# Patient Record
Sex: Male | Born: 1945 | Race: White | Hispanic: No | Marital: Married | Smoking: Former smoker
Health system: Southern US, Community
[De-identification: ages and names within clinical notes are randomized; demographics above are authoritative.]

## PROBLEM LIST (undated history)

## (undated) DIAGNOSIS — J449 Chronic obstructive pulmonary disease, unspecified: Secondary | ICD-10-CM

## (undated) HISTORY — PX: LASIK: SHX215

## (undated) HISTORY — PX: VASECTOMY: SHX75

## (undated) HISTORY — PX: TONSILLECTOMY: SUR1361

---

## 2010-10-15 ENCOUNTER — Ambulatory Visit: Payer: Self-pay | Admitting: Family Medicine

## 2012-06-28 IMAGING — CT CT ABD-PELV W/ CM
1 of 3 series · 12 of 32 positions shown, 18 images · non-contrast
Comparison: none

REASON FOR EXAM: CR [id number]   Rectal Bleeding
COMMENTS:

[Series 2: soft tissue · axial · 0.93mm/px · z∈[-336,+132]mm · 12 of 184 slices shown, 18 images]
[im 14/184  soft-tissue]
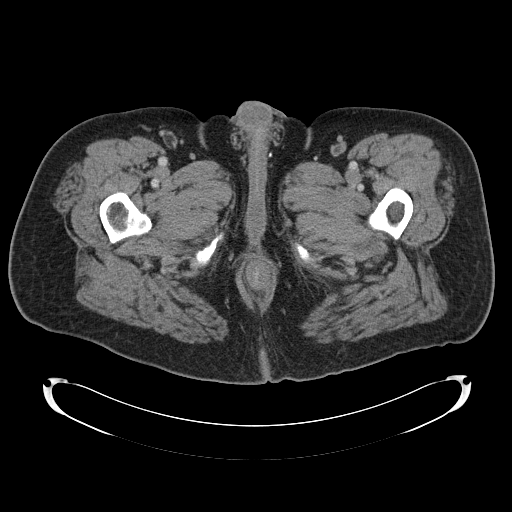
[im 14/184  bone]
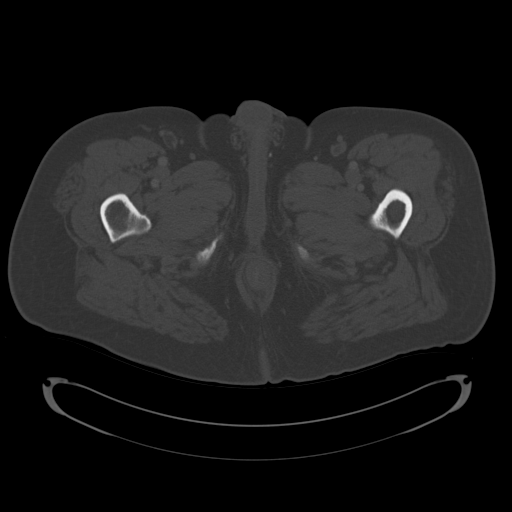
[im 27/184  soft-tissue]
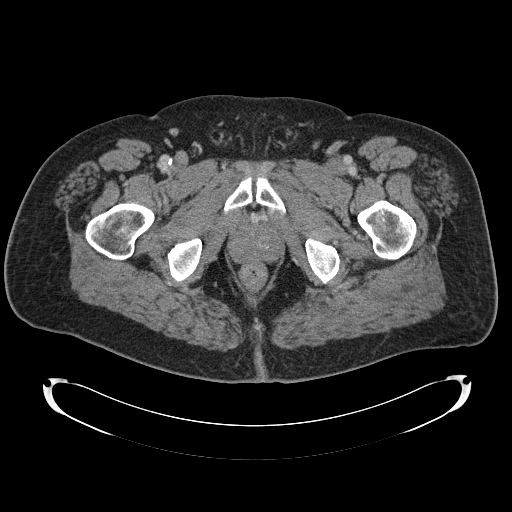
[im 40/184  soft-tissue]
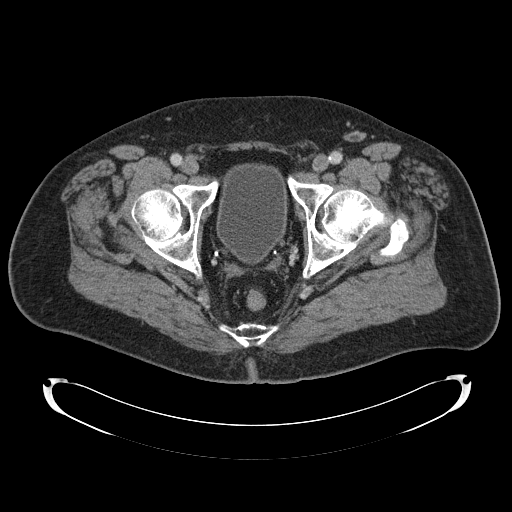
[im 53/184  soft-tissue]
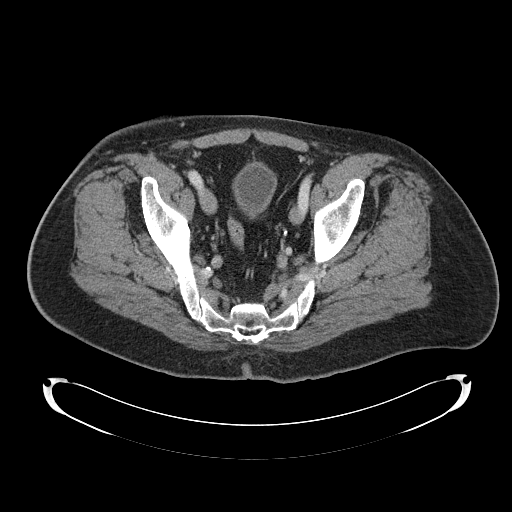
[im 66/184  soft-tissue]
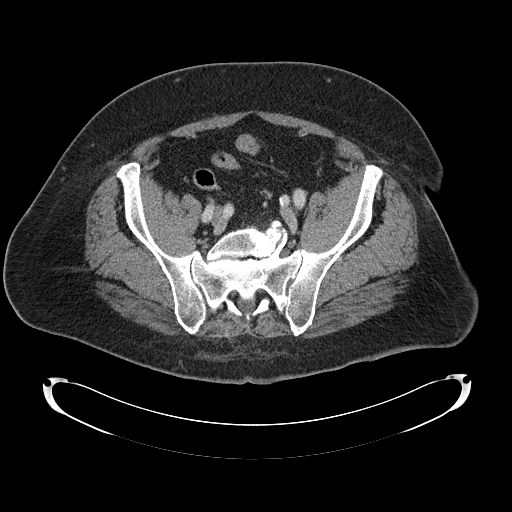
[im 79/184  soft-tissue]
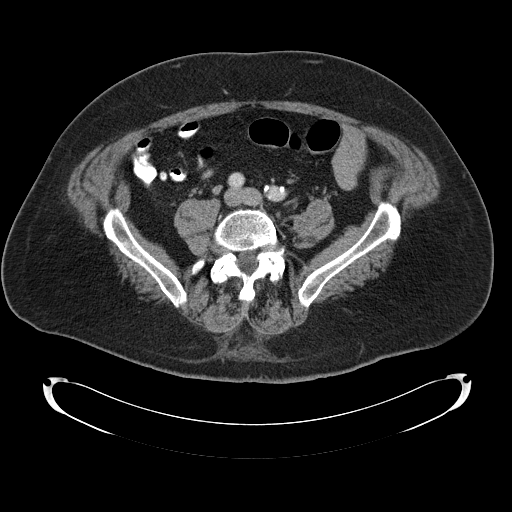
[im 105/184  soft-tissue]
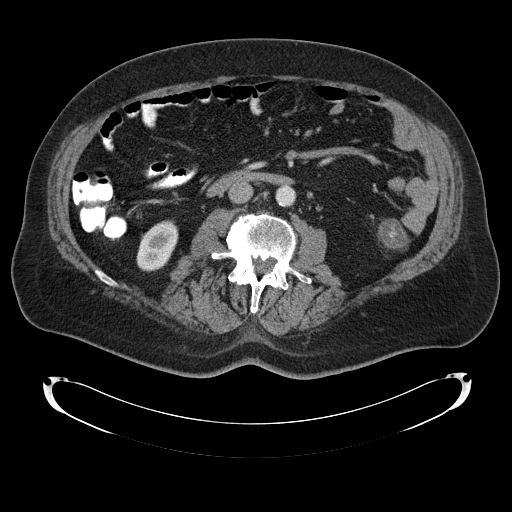
[im 118/184  soft-tissue]
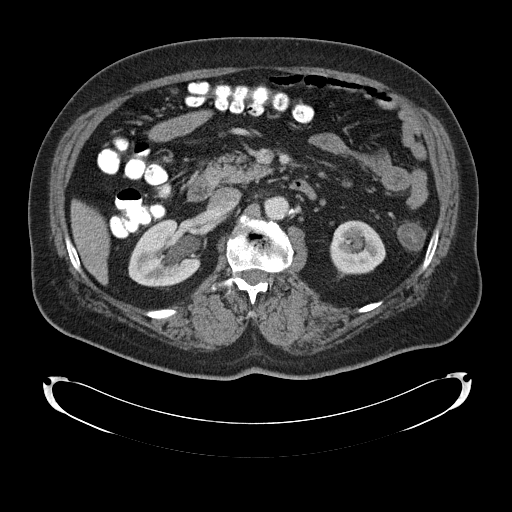
[im 131/184  soft-tissue]
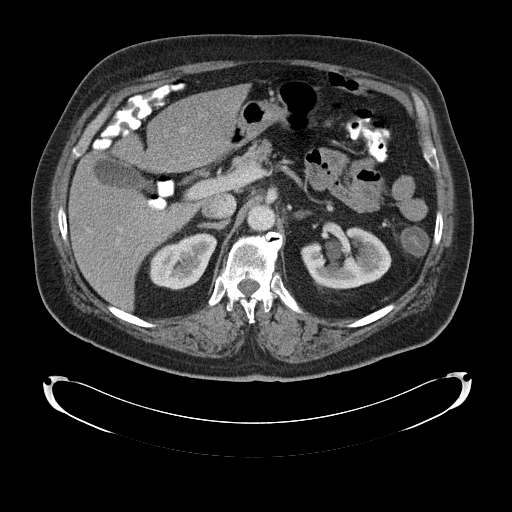
[im 131/184  lung]
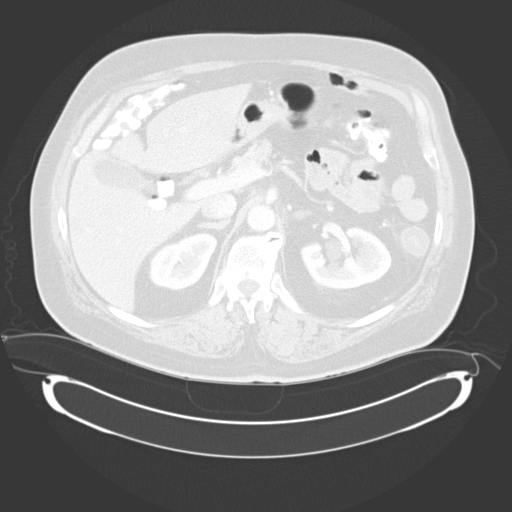
[im 131/184  bone]
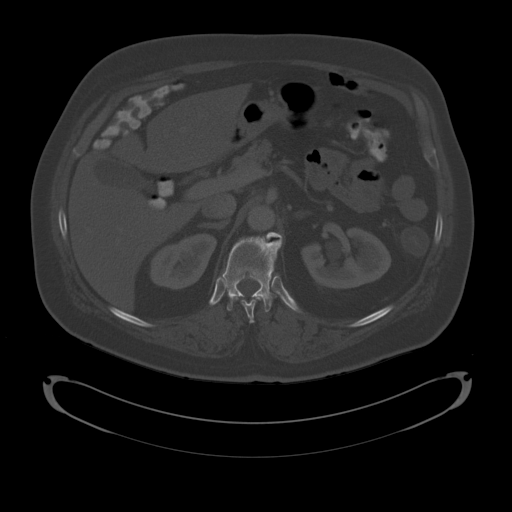
[im 144/184  soft-tissue]
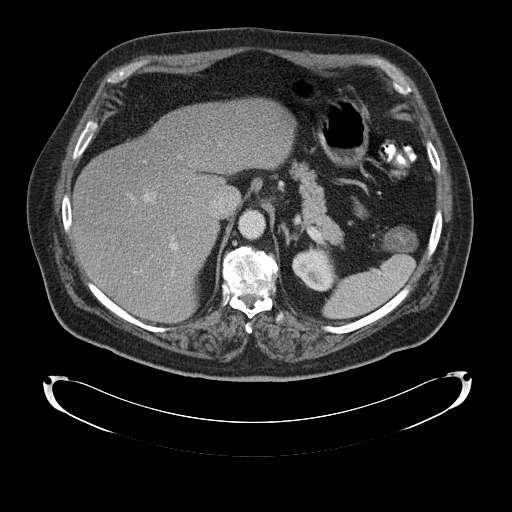
[im 144/184  lung]
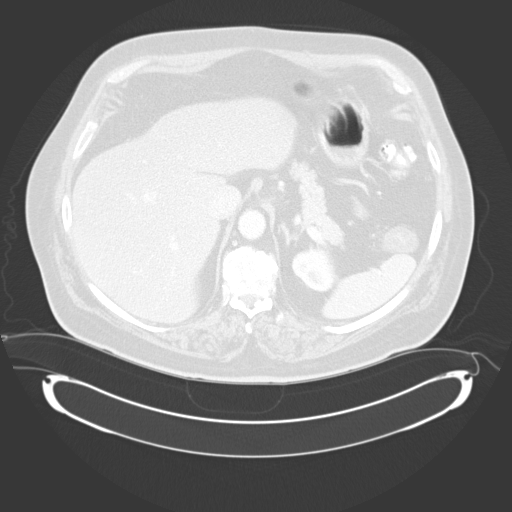
[im 157/184  soft-tissue]
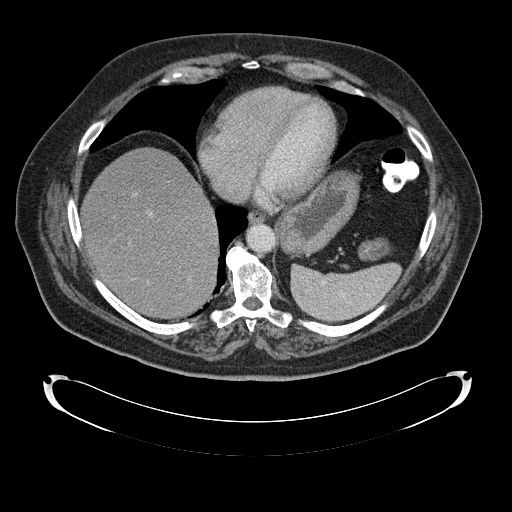
[im 157/184  lung]
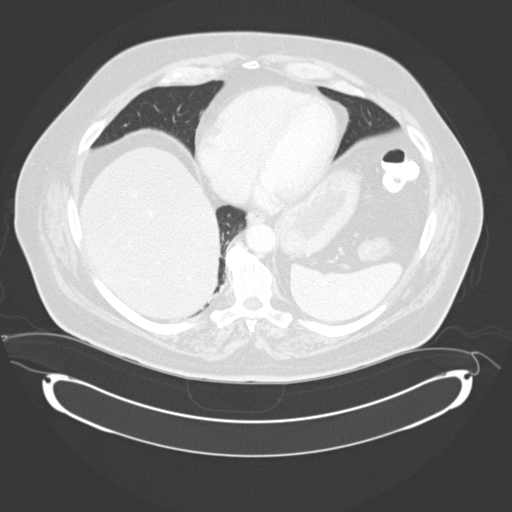
[im 170/184  soft-tissue]
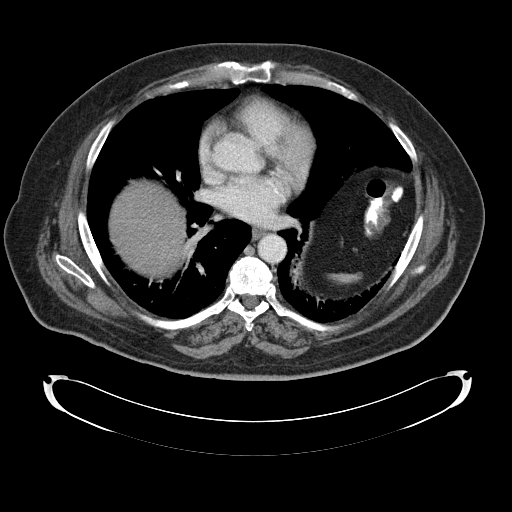
[im 170/184  lung]
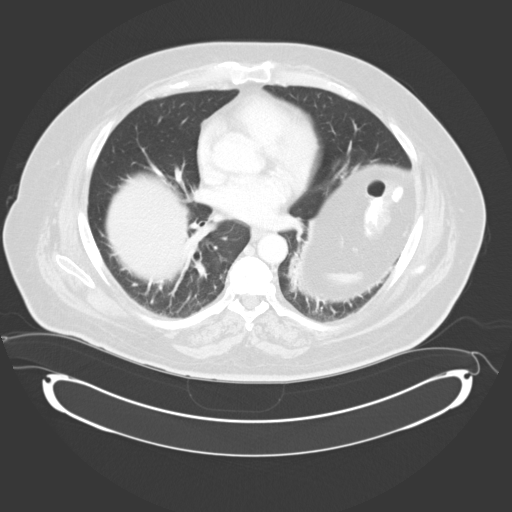

[12 of 32 positions shown; findings below may reference images not displayed]

PROCEDURE:     CT  - CT ABDOMEN / PELVIS  W  - October 15, 2010  [DATE]

RESULT:     Axial CT scanning was performed through the abdomen and pelvis
at 3 mm intervals following intravenous administration of 100 cc of
Dsovue-ZRL as well as administration of oral contrast material. Review of
multiplanar reconstructed images was performed separately on the VIA monitor.

The orally administered contrast has traversed the small bowel and reached
the proximal rectosigmoid colon. However, beginning at the splenic flexure
and extending to the pelvic brim the descending colon exhibits diffusely
thickened wall and a very tiny contrast-filled lumen. I do not see
pericolonic inflammatory changes here. The ascending and transverse and
rectosigmoid portions of the colon appear normal. The small bowel loops
exhibit no acute abnormality.

The liver, gallbladder, pancreas, spleen, nondistended stomach, and adrenal
glands are normal in appearance. There are bilateral renal parapelvic cysts.
The ureters are fractionally visualized but are normal in course and
caliber. An irregular filling defect in the base of the urinary bladder is
seen which may be related to the prostate but other entities could be
considered.

The partially distended urinary bladder exhibits a prominent impression upon
its base from the enlarged prostate gland. I see no pelvic sidewall
lymphadenopathy nor inguinal lymphadenopathy.

On delayed images contrast can be seen surrounding parapelvic cysts in both
kidneys.

The lumbar vertebral bodies are preserved in height. Degenerative disc
changes noted at multiple levels. The lung bases are clear.
IMPRESSION: 1. There is diffuse thickening of the wall of the descending colon
consistent with colitis. I do not see diverticulitis. I see no evidence of
obstruction or perforation of bowel. A normal appearing appendix is
demonstrated on the right.
2. There are parapelvic cysts in both kidneys but no acute abnormality of
either kidney. There is a prominent impression upon the urinary bladder base
with an irregular intraluminal component which may reflect the enlarged
prostate but a primary bladder abnormality cannot be excluded. Urologic
evaluation is recommended.
3. I do not see acute hepatobiliary abnormality. The kidneys exhibit no
acute abnormality. The caliber of the abdominal aorta is normal.

This report was called to [REDACTED] acute care and the report given to
Blain at [DATE] p.m. on 15 October, 2010.

## 2018-07-31 ENCOUNTER — Other Ambulatory Visit: Payer: Self-pay

## 2018-07-31 ENCOUNTER — Ambulatory Visit
Admission: EM | Admit: 2018-07-31 | Discharge: 2018-07-31 | Disposition: A | Discharge status: Home / Self Care | Payer: Medicare Other | Attending: Family Medicine | Admitting: Family Medicine

## 2018-07-31 DIAGNOSIS — Z87891 Personal history of nicotine dependence: Secondary | ICD-10-CM | POA: Diagnosis not present

## 2018-07-31 DIAGNOSIS — J441 Chronic obstructive pulmonary disease with (acute) exacerbation: Secondary | ICD-10-CM | POA: Diagnosis not present

## 2018-07-31 HISTORY — DX: Chronic obstructive pulmonary disease, unspecified: J44.9

## 2018-07-31 MED ORDER — DOXYCYCLINE HYCLATE 100 MG PO TABS: 100.0000 mg | ORAL_TABLET | Freq: Two times a day (BID) | ORAL | 0 refills | Status: DC

## 2018-07-31 MED ORDER — PREDNISONE 20 MG PO TABS: ORAL_TABLET | ORAL | 0 refills | Status: DC

## 2018-07-31 MED ORDER — IPRATROPIUM-ALBUTEROL 0.5-2.5 (3) MG/3ML IN SOLN
3.0000 mL | Freq: Once | RESPIRATORY_TRACT | Status: AC
  Administered 2018-07-31: 3 mL via RESPIRATORY_TRACT

## 2018-07-31 NOTE — ED Triage Notes (Signed)
Patient complains of cough, congestion, body aches. Patient states that symptoms started around 3 weeks ago have been constant.

## 2018-07-31 NOTE — Discharge Instructions (Signed)
Follow up with Primary Care provider 

## 2018-07-31 NOTE — ED Provider Notes (Signed)
[facility]    CSN: [id number] Arrival date & time: 07/31/18  0856     History   Chief Complaint Chief Complaint  Patient presents with  . Cough    HPI Adam Mcpherson is a 73 y.o. male.   The history is provided by the patient.  Cough  Associated symptoms: wheezing   URI  Presenting symptoms: congestion and cough   Severity:  Moderate Onset quality:  Sudden Duration:  3 weeks Timing:  Constant Progression:  Worsening Chronicity:  New Relieved by:  None tried Ineffective treatments:  None tried Associated symptoms: wheezing   Risk factors: chronic respiratory disease (copd) and sick contacts     Past Medical History:  Diagnosis Date  . COPD (chronic obstructive pulmonary disease) (HCC)     There are no active problems to display for this patient.   Past Surgical History:  Procedure Laterality Date  . LASIK    . TONSILLECTOMY    . VASECTOMY         Home Medications    Prior to Admission medications   Medication Sig Start Date End Date Taking? Authorizing Provider  aspirin 81 MG chewable tablet Chew by mouth daily.   Yes [provider]  chlorhexidine (PERIDEX) 0.12 % solution Use as directed 15 mLs in the mouth or throat 2 (two) times daily.   Yes [provider]  fluticasone (FLONASE) 50 MCG/ACT nasal spray Place into both nostrils daily.   Yes [provider]  furosemide (LASIX) 20 MG tablet Take 20 mg by mouth.   Yes [provider]  gabapentin (NEURONTIN) 300 MG capsule Take 300 mg by mouth 3 (three) times daily.   Yes [provider]  ipratropium-albuterol (DUONEB) 0.5-2.5 (3) MG/3ML SOLN Take 3 mLs by nebulization.   Yes [provider]  loratadine (CLARITIN) 10 MG tablet Take 10 mg by mouth daily.   Yes [provider]  OXYGEN Inhale 2 L/day into the lungs.   Yes [provider]  triamcinolone ointment (KENALOG) 0.1 % Apply 1 application topically 2 (two)  times daily.   Yes [provider]  doxycycline (VIBRA-TABS) 100 MG tablet Take 1 tablet (100 mg total) by mouth 2 (two) times daily. 07/31/18   Payton Mccallumonty, Edin Kon, MD  predniSONE (DELTASONE) 20 MG tablet 3 tabs po qd x 2 days, then 2 tabs po qd x 2 days, then 1 tab po qd x 2 days, then half a tab po qd x 2 days 07/31/18   Payton Mccallumonty, Shyann Hefner, MD    Family History Family History  Problem Relation Age of Onset  . Heart disease Mother   . Pancreatic cancer Father     Social History Social History   Tobacco Use  . Smoking status: Former Games developermoker  . Smokeless tobacco: Never Used  Substance Use Topics  . Alcohol use: Not Currently    Frequency: Never  . Drug use: Not Currently     Allergies   Penicillins   Review of Systems Review of Systems  HENT: Positive for congestion.   Respiratory: Positive for cough and wheezing.      Physical Exam Triage Vital Signs ED Triage Vitals  Enc Vitals Group     BP 07/31/18 0916 (!) 119/58     Pulse Rate 07/31/18 0916 70     Resp 07/31/18 0916 17     Temp 07/31/18 0916 99.1 F (37.3 C)     Temp Source 07/31/18 0916 Oral     SpO2  07/31/18 0916 95 %     Weight 07/31/18 0909 250 lb (113.4 kg)     Height 07/31/18 0909 5\' 7"  (1.702 m)     Head Circumference --      Peak Flow --      Pain Score 07/31/18 0909 4     Pain Loc --      Pain Edu? --      Excl. in GC? --    No data found.  Updated Vital Signs BP (!) 119/58 (BP Location: Left Arm)   Pulse 70   Temp 99.1 F (37.3 C) (Oral)   Resp 17   Ht 5\' 7"  (1.702 m)   Wt 113.4 kg   SpO2 95%   BMI 39.16 kg/m   Visual Acuity Right Eye Distance:   Left Eye Distance:   Bilateral Distance:    Right Eye Near:   Left Eye Near:    Bilateral Near:     Physical Exam Vitals signs and nursing note reviewed.  Constitutional:      General: He is not in acute distress.    Appearance: He is well-developed. He is not diaphoretic.  HENT:     Head: Normocephalic and atraumatic.      Right Ear: Tympanic membrane, ear canal and external ear normal.     Left Ear: Tympanic membrane, ear canal and external ear normal.     Nose: Nose normal.     Mouth/Throat:     Pharynx: Uvula midline. No oropharyngeal exudate.     Tonsils: No tonsillar abscesses.  Eyes:     General: No scleral icterus.       Right eye: No discharge.        Left eye: No discharge.  Neck:     Musculoskeletal: Normal range of motion and neck supple.     Thyroid: No thyromegaly.     Trachea: No tracheal deviation.  Cardiovascular:     Rate and Rhythm: Normal rate and regular rhythm.     Heart sounds: Normal heart sounds.  Pulmonary:     Effort: Pulmonary effort is normal. No respiratory distress.     Breath sounds: No stridor. Wheezing and rhonchi present. No rales.  Chest:     Chest wall: No tenderness.  Lymphadenopathy:     Cervical: No cervical adenopathy.  Skin:    General: Skin is warm and dry.     Findings: No rash.  Neurological:     Mental Status: He is alert.      UC Treatments / Results  Labs (all labs ordered are listed, but only abnormal results are displayed) Labs Reviewed - No data to display  EKG None  Radiology No results found.  Procedures Procedures (including critical care time)  Medications Ordered in UC Medications  ipratropium-albuterol (DUONEB) 0.5-2.5 (3) MG/3ML nebulizer solution 3 mL (3 mLs Nebulization Given 07/31/18 1015)    Initial Impression / Assessment and Plan / UC Course  I have reviewed the triage vital signs and the nursing notes.  Pertinent labs & imaging results that were available during my care of the patient were reviewed by me and considered in my medical decision making (see chart for details).      Final Clinical Impressions(s) / UC Diagnoses   Final diagnoses:  COPD exacerbation [facility])     Discharge Instructions     Follow up with Primary Care provider    ED Prescriptions    Medication Sig Dispense Auth. Provider    doxycycline (VIBRA-TABS)  100 MG tablet Take 1 tablet (100 mg total) by mouth 2 (two) times daily. 20 tablet Payton Mccallum, MD   predniSONE (DELTASONE) 20 MG tablet 3 tabs po qd x 2 days, then 2 tabs po qd x 2 days, then 1 tab po qd x 2 days, then half a tab po qd x 2 days 13 tablet Gretna Bergin, Pamala Hurry, MD    1.  diagnosis reviewed with patient 2. rx as per orders above; reviewed possible side effects, interactions, risks and benefits  3. Recommend supportive treatment rest, fluids 4. Follow-up prn if symptoms worsen or don't improve Controlled Substance Prescriptions [place] Controlled Substance Registry consulted? Not Applicable   Payton Mccallum, MD 07/31/18 1029

## 2023-09-03 DEATH — deceased
